# Patient Record
Sex: Male | Born: 1980 | Race: White | Hispanic: No | Marital: Single | Smoking: Current every day smoker
Health system: Southern US, Community
[De-identification: ages and names within clinical notes are randomized; demographics above are authoritative.]

## PROBLEM LIST (undated history)

## (undated) DIAGNOSIS — I10 Essential (primary) hypertension: Secondary | ICD-10-CM

---

## 2017-04-09 ENCOUNTER — Emergency Department (HOSPITAL_COMMUNITY): Payer: Worker's Compensation

## 2017-04-09 ENCOUNTER — Encounter (HOSPITAL_COMMUNITY)
Admission: EM | Disposition: A | Discharge status: Home / Self Care | Payer: Self-pay | Source: Home / Self Care | Attending: Emergency Medicine

## 2017-04-09 ENCOUNTER — Emergency Department (HOSPITAL_COMMUNITY)
Admission: EM | Admit: 2017-04-09 | Discharge: 2017-04-09 | Disposition: A | Discharge status: Home / Self Care | Payer: Worker's Compensation | Attending: Emergency Medicine | Admitting: Emergency Medicine

## 2017-04-09 ENCOUNTER — Encounter (HOSPITAL_COMMUNITY): Payer: Self-pay

## 2017-04-09 DIAGNOSIS — W231XXA Caught, crushed, jammed, or pinched between stationary objects, initial encounter: Secondary | ICD-10-CM | POA: Diagnosis not present

## 2017-04-09 DIAGNOSIS — Y929 Unspecified place or not applicable: Secondary | ICD-10-CM | POA: Insufficient documentation

## 2017-04-09 DIAGNOSIS — Z23 Encounter for immunization: Secondary | ICD-10-CM | POA: Insufficient documentation

## 2017-04-09 DIAGNOSIS — S68613A Complete traumatic transphalangeal amputation of left middle finger, initial encounter: Secondary | ICD-10-CM | POA: Diagnosis not present

## 2017-04-09 DIAGNOSIS — Y939 Activity, unspecified: Secondary | ICD-10-CM | POA: Diagnosis not present

## 2017-04-09 DIAGNOSIS — Y990 Civilian activity done for income or pay: Secondary | ICD-10-CM | POA: Insufficient documentation

## 2017-04-09 DIAGNOSIS — I10 Essential (primary) hypertension: Secondary | ICD-10-CM | POA: Diagnosis not present

## 2017-04-09 DIAGNOSIS — S6992XA Unspecified injury of left wrist, hand and finger(s), initial encounter: Secondary | ICD-10-CM | POA: Diagnosis present

## 2017-04-09 DIAGNOSIS — S62639B Displaced fracture of distal phalanx of unspecified finger, initial encounter for open fracture: Secondary | ICD-10-CM

## 2017-04-09 HISTORY — DX: Essential (primary) hypertension: I10

## 2017-04-09 SURGERY — AMPUTATION DIGIT
Anesthesia: Choice | Laterality: Left

## 2017-04-09 MED ORDER — SODIUM CHLORIDE 0.9 % IV SOLN
Freq: Once | INTRAVENOUS | Status: AC
  Administered 2017-04-09: 07:00:00 via INTRAVENOUS

## 2017-04-09 MED ORDER — TETANUS-DIPHTH-ACELL PERTUSSIS 5-2.5-18.5 LF-MCG/0.5 IM SUSP
0.5000 mL | Freq: Once | INTRAMUSCULAR | Status: AC
  Administered 2017-04-09: 0.5 mL via INTRAMUSCULAR
  Filled 2017-04-09: qty 0.5

## 2017-04-09 MED ORDER — BUPIVACAINE HCL (PF) 0.5 % IJ SOLN
10.0000 mL | Freq: Once | INTRAMUSCULAR | Status: AC
  Administered 2017-04-09: 10 mL
  Filled 2017-04-09: qty 10

## 2017-04-09 MED ORDER — LIDOCAINE HCL (PF) 1 % IJ SOLN
10.0000 mL | Freq: Once | INTRAMUSCULAR | Status: AC
  Administered 2017-04-09: 10 mL via INTRADERMAL
  Filled 2017-04-09: qty 10

## 2017-04-09 MED ORDER — ONDANSETRON HCL 4 MG/2ML IJ SOLN
4.0000 mg | Freq: Once | INTRAMUSCULAR | Status: AC
  Administered 2017-04-09: 4 mg via INTRAVENOUS
  Filled 2017-04-09: qty 2

## 2017-04-09 MED ORDER — HYDROMORPHONE HCL 1 MG/ML IJ SOLN
1.0000 mg | Freq: Once | INTRAMUSCULAR | Status: AC
  Administered 2017-04-09: 1 mg via INTRAVENOUS
  Filled 2017-04-09: qty 1

## 2017-04-09 MED ORDER — CEFAZOLIN SODIUM-DEXTROSE 2-4 GM/100ML-% IV SOLN
2.0000 g | Freq: Once | INTRAVENOUS | Status: AC
  Administered 2017-04-09: 2 g via INTRAVENOUS
  Filled 2017-04-09: qty 100

## 2017-04-09 MED ORDER — LIDOCAINE HCL (PF) 1 % IJ SOLN
10.0000 mL | Freq: Once | INTRAMUSCULAR | Status: AC
  Administered 2017-04-09: 10 mL
  Filled 2017-04-09: qty 10

## 2017-04-09 MED ORDER — POVIDONE-IODINE 10 % EX SWAB: 2.0000 "application " | Freq: Once | CUTANEOUS | Status: DC

## 2017-04-09 NOTE — Consult Note (Signed)
Reason for Consult: Left middle finger crush injury Referring Physician: Emergency Dept.  Seth Allen is an 36 y.o. male.  HPI: The patient is a pleasant [age]-year-old gentleman, who is ambidextrous, the presents to the emergency room setting where a new evaluation of his left middle finger. 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 He is previous been seen and evaluated emergency room setting. He was previously given a digital block and underwent a preliminary irrigation. At the time of his evaluation the digital block is fleeting. He denies any other injury. The finger has been previously wrapped with a wet gauze. He is accompanied with his supervisor from work as well as family members. The patient did bring in the amputated portion of the distal finger. He is currently receiving Ancef for antibiotic prophylaxis.  Past Medical History:  Diagnosis Date  . Hypertension     History reviewed. No pertinent surgical history.  No family history on file.  Social History: The patient smokes cigarettes, denies alcohol or drug use.  Allergies: No Known Allergies  Medications: None  No results found for this or any previous visit (from the past 48 hour(s)).  Dg Finger Middle Left  Result Date: 04/09/2017 CLINICAL DATA:  Work related injury EXAM: LEFT THIRD FINGER 2+V COMPARISON:  None. FINDINGS: Frontal, oblique, and lateral views were obtained. There is soft tissue injury to the distal aspect of the third digit with comminuted fracture of the mid to distal aspect of the third distal phalanx. A portion the third distal phalanx more distally has been amputated. Bony fragment is noted volar and lateral to the mid portion of the third distal phalanx. No other fractures. No dislocation. Joint spaces appear normal. No  erosive change. IMPRESSION: Comminuted fracture mid to distal aspects of the third distal phalanx with multiple displaced fragments. There has been amputation of a portion of the third distal phalanx. Extensive soft tissue injury evident. No fractures proximal to the third distal phalanx. No dislocation. No evident arthropathy. Electronically Signed   By: Bretta Bang III M.D.   On: 04/09/2017 15:20    Review of Systems  Constitutional: Negative.   HENT: Negative.   Eyes: Negative.   Respiratory: Negative.   Cardiovascular: Negative.   Gastrointestinal: Negative.   Musculoskeletal:       See history of present illness  Skin: Negative.   Neurological: Negative.   Endo/Heme/Allergies: Negative.    Blood pressure 111/77, pulse 89, temperature 98.1 F (36.7 C), temperature source Oral, resp. rate 15, SpO2 100 %. Physical Exam The patient is alert and oriented in no acute distress. The patient complains of pain in the affected upper extremity.  The patient is noted to have a normal HEENT exam. Lung fields show equal chest expansion and no shortness of breath. Abdomen exam is nontender without distention. Lower extremity examination does not show any fracture dislocation or blood clot symptoms. Pelvis is stable and the neck and back are stable and nontender Examination of the left hand shows that he has a significant degree of all tissue disarray with obvious a amputation of the distal phalanx at the level of the eponychial fold. He has exposed bony shards of the distal phalanx present extruding from wound, it appears FDS and terminal extensor tendon remained intact. Central slip is intact FDS is intact. Remaining examination of the  hand is within normal limits however he has a significant degree of grease and dirt about the hand and fingers Assessment/Plan: On-the-job injury to the left middle finger resulting in a partial amputation of the leftt middle finger with open fracture and  significant soft tissue disarray Tobacco abuse We have discussed with the patient the nature of his upper extremity predicament and our recommendations to proceed with urgent I and D and revision amputation. He understands this. Consent was obtained. Following this the patient was preliminarily prepped given the significant degree of grime about his hand. All in this digital block was implemented to the left middle finger utilizing a combination of 5 mL of Xylocaine without epinephrine as well as 5 mL of Marcaine without epinephrine. He tolerated this well and there were no complications. Once appropriate anesthetic was obtained the patient then underwent a second scrub and paint. Over 3 L of sterile water was used to irrigate the wound about the distal tip. Following this attention was turned to the distal tip, he underwent excisional debridement of skin subcutaneous tissue as well as a significant amount of bony debris. Utilizing a Ronguer the exposed distal phalanx was shortened and trimmed of any sharp edges. Further exploration showed that his terminal extensor tendon appeared intact as well as the FDP . There was no viable nailbed present as the amputation was at the level of the eponychial fold. Revision amputation was then performed and the wound was closed with a series of or 0 Prolene as well as 4-0 chromic. This is a complex Facilities managerlaceration/revision repair. He tolerated this well and there was no complicating  features. Finger tourniquet was released, refill is intact at the conclusion of the case. Following this nonadherent dressings to consist of Adaptic, Xeroform were placed on the wound off dressings as well as a malleable finger splint were then applied to the finger. We discussed with the patient at length elevation, edema control and to keep his dressings clean dry and intact. He will be discharged home on Bactrim DS 1 by mouth twice a day for 2 weeks for prophylaxis. We have discussed with him our  concerns for the potential of infection given the open nature and the debris that was present on his hands. He was given oxycodone for pain in addition, we have recommended Peri-Colace and vitamin C thousand milligrams 1 daily. He will be out of work to further notice. We will need to follow the patient in our office setting in approximately 8-10 days. He will need therapy thereafter for a custom-made distal tip protector. Should he have any issues, questions or concerns he will contact us immediately. Discussed with him the severity of his injury as well as potential for infection, loss of length about the digit and possible need for reconstructive measures into the future. All questions were encouraged and answered.  Curry Seefeldt L 04/09/2017, 7:24 PM

## 2017-04-09 NOTE — ED Notes (Signed)
Pt departed in NAD, refused use of wheelchair.  

## 2017-04-09 NOTE — ED Provider Notes (Addendum)
[facility]   CSN: [id number] Arrival date & time: 04/09/17  1446     History   Chief Complaint Chief Complaint  Patient presents with  . Finger Injury    HPI Seth AlconKevin C Bonaparte is a 36 y.o. male with history of tobacco use presents to the ED for left middle finger amputation sustained at work. He is left-handed. Bleeding is controlled. Last by mouth intake at 1330.  HPI  Past Medical History:  Diagnosis Date  . Hypertension     There are no active problems to display for this patient.   History reviewed. No pertinent surgical history.     Home Medications    Prior to Admission medications   Not on File    Family History No family history on file.  Social History Social History  Substance Use Topics  . Smoking status: Not on file  . Smokeless tobacco: Not on file  . Alcohol use Not on file     Allergies   Patient has no known allergies.   Review of Systems Review of Systems  Musculoskeletal: Positive for arthralgias.  Skin: Positive for color change and wound.  Neurological: Negative for numbness.     Physical Exam Updated Vital Signs BP 111/77 (BP Location: Right Arm)   Pulse 89   Temp 98.1 F (36.7 C) (Oral)   Resp 15   SpO2 100%   Physical Exam  Constitutional: He is oriented to person, place, and time. He appears well-developed and well-nourished. No distress.  NAD.  HENT:  Head: Normocephalic and atraumatic.  Right Ear: External ear normal.  Left Ear: External ear normal.  Nose: Nose normal.  Eyes: Conjunctivae and EOM are normal. No scleral icterus.  Neck: Normal range of motion. Neck supple.  Cardiovascular: Normal rate, regular rhythm, normal heart sounds and intact distal pulses.   No murmur heard. Pulmonary/Chest: Effort normal and breath sounds normal. He has no wheezes.  Musculoskeletal: Normal range of motion. He exhibits deformity.  Complete left middle amputation  at distal phalanx above first knuckle  Neurological: He is alert and oriented to person, place, and time.  Sensation to light touch intact in left middle finger  Skin: Skin is warm and dry. Capillary refill takes less than 2 seconds.  Psychiatric: He has a normal mood and affect. His behavior is normal. Judgment and thought content normal.  Nursing note and vitals reviewed.    ED Treatments / Results  Labs (all labs ordered are listed, but only abnormal results are displayed) Labs Reviewed - No data to display  EKG  EKG Interpretation None       Radiology Dg Finger Middle Left  Result Date: 04/09/2017 CLINICAL DATA:  Work related injury EXAM: LEFT THIRD FINGER 2+V COMPARISON:  None. FINDINGS: Frontal, oblique, and lateral views were obtained. There is soft tissue injury to the distal aspect of the third digit with comminuted fracture of the mid to distal aspect of the third distal phalanx. A portion the third distal phalanx more distally has been amputated. Bony fragment is noted volar and lateral to the mid portion of the third distal phalanx. No other fractures. No dislocation. Joint spaces appear normal. No erosive change. IMPRESSION: Comminuted fracture mid to distal aspects of the third distal phalanx with multiple displaced fragments. There has been amputation of a portion of the third distal phalanx. Extensive soft tissue injury evident. No fractures proximal to the third distal phalanx. No dislocation. No evident arthropathy.  Electronically Signed   By: Bretta Bang III M.D.   On: 04/09/2017 15:20    Procedures Procedures (including critical care time)  Medications Ordered in ED Medications  povidone-iodine 10 % swab 2 application (not administered)  lidocaine (PF) (XYLOCAINE) 1 % injection 10 mL (10 mLs Other Given 04/09/17 1531)  HYDROmorphone (DILAUDID) injection 1 mg (1 mg Intravenous Given 04/09/17 1538)  Tdap (BOOSTRIX) injection 0.5 mL (0.5 mLs Intramuscular  Given 04/09/17 1538)  0.9 %  sodium chloride infusion ( Intravenous Stopped 04/09/17 1714)  ondansetron (ZOFRAN) injection 4 mg (4 mg Intravenous Given 04/09/17 1544)  ceFAZolin (ANCEF) IVPB 2g/100 mL premix (2 g Intravenous New Bag/Given 04/09/17 1712)  bupivacaine (MARCAINE) 0.5 % injection 10 mL (10 mLs Infiltration Given 04/09/17 1713)  lidocaine (PF) (XYLOCAINE) 1 % injection 10 mL (10 mLs Intradermal Given 04/09/17 1713)     Initial Impression / Assessment and Plan / ED Course  I have reviewed the triage vital signs and the nursing notes.  Pertinent labs & imaging results that were available during my care of the patient were reviewed by me and considered in my medical decision making (see chart for details).  Clinical Course as of Apr 10 1947  Tue Apr 09, 2017  1610 Dr Amanda Pea evaluated pt and performed procedure in ED. Pt d/c from ED with prescriptions, he is advised to f/u with Dr Amanda Pea in 8-10 days.  [CG]    Clinical Course User Index [CG] Liberty Handy, PA-C   Orthopedic consultation who evaluated patient in the ED and perform digital block. Patient given Ancef, Dilaudid and small bolus in ED with adequate control of pain. Patient awaiting OR by Dr. Amanda Pea. Discussed plan of care with pt and boss at bedside, they are agreeable. NPO til OR.   Final Clinical Impressions(s) / ED Diagnoses   Final diagnoses:  Avulsion fracture of distal phalanx of finger, open, initial encounter    New Prescriptions New Prescriptions   No medications on file     Pricilla Loveless, MD 04/09/17 1710    Liberty Handy, PA-C 04/09/17 1948    Pricilla Loveless, MD 04/10/17 [phone]

## 2017-04-09 NOTE — Discharge Instructions (Signed)
Take bactrim as prescribed for antibiotic prophylaxis. Oxycodone for pain. You can add 600 mg ibuprofen for pain every 8 hours as well.  Follow up with Dr. Amanda PeaGramig in 8-10 days in office for re-evaluation.  Return to ED for if you develop fevers, chills, swelling, redness, yellow drainage or any other signs of infection.  Tetanus was updated today.

## 2017-04-09 NOTE — Consult Note (Signed)
Reason for Consult:Amp left middle finger Referring Physician: Alice ReichertS Allen  Seth Allen is an 36 y.o. male.  HPI: Seth Allen was working with a pipe when the sleeve snapped back and sheared off the tip of his middle finger. Seth Allen is LHD.   Past Medical History:  Diagnosis Date  . Hypertension     History reviewed. No pertinent surgical history.  No family history on file.  Social History:  has no tobacco, alcohol, and drug history on file.  Allergies: No Known Allergies  Medications: I have reviewed the patient'Seth current medications.  No results found for this or any previous visit (from the past 48 hour(Seth)).  Dg Finger Middle Left  Result Date: 04/09/2017 CLINICAL DATA:  Work related injury EXAM: LEFT THIRD FINGER 2+V COMPARISON:  None. FINDINGS: Frontal, oblique, and lateral views were obtained. There is soft tissue injury to the distal aspect of the third digit with comminuted fracture of the mid to distal aspect of the third distal phalanx. A portion the third distal phalanx more distally has been amputated. Bony fragment is noted volar and lateral to the mid portion of the third distal phalanx. No other fractures. No dislocation. Joint spaces appear normal. No erosive change. IMPRESSION: Comminuted fracture mid to distal aspects of the third distal phalanx with multiple displaced fragments. There has been amputation of a portion of the third distal phalanx. Extensive soft tissue injury evident. No fractures proximal to the third distal phalanx. No dislocation. No evident arthropathy. Electronically Signed   By: Bretta BangWilliam  Woodruff III M.D.   On: 04/09/2017 15:20    Review of Systems  Constitutional: Negative for weight loss.  HENT: Negative for ear discharge, ear pain, hearing loss and tinnitus.   Eyes: Negative for blurred vision, double vision, photophobia and pain.  Respiratory: Negative for cough, sputum production and shortness of breath.   Cardiovascular: Negative for chest  pain.  Gastrointestinal: Negative for abdominal pain, nausea and vomiting.  Genitourinary: Negative for dysuria, flank pain, frequency and urgency.  Musculoskeletal: Positive for joint pain (left middle finger). Negative for back pain, falls, myalgias and neck pain.  Neurological: Negative for dizziness, tingling, sensory change, focal weakness, loss of consciousness and headaches.  Endo/Heme/Allergies: Does not bruise/bleed easily.  Psychiatric/Behavioral: Negative for depression, memory loss and substance abuse. The patient is not nervous/anxious.    Blood pressure 111/77, pulse 89, temperature 98.1 F (36.7 C), temperature source Oral, resp. rate 15, SpO2 100 %. Physical Exam  Constitutional: Seth Allen appears well-developed and well-nourished. No distress.  HENT:  Head: Normocephalic.  Eyes: Conjunctivae are normal. Right eye exhibits no discharge. Left eye exhibits no discharge. No scleral icterus.  Neck: Normal range of motion.  Cardiovascular: Normal rate and regular rhythm.   Respiratory: Effort normal. No respiratory distress.  Neurological: Seth Allen is alert.  Skin: Skin is warm and dry. Seth Allen is not diaphoretic.  Psychiatric: Seth Allen has a normal mood and affect. His behavior is normal.      Assessment/Plan: Left middle finger distal phalanx open fx -- Digital block for pain control. IV ancef. Will need to go to OR for amputation revision by Dr. Amanda PeaGramig. NPO until then.     Freeman CaldronMichael J. Naje Rice, PA-C Orthopedic Surgery [phone] 04/09/2017, 4:09 PM

## 2017-04-09 NOTE — ED Triage Notes (Signed)
Pt presents for left middle finger amputation at second joint. Pt was at work, bleeding controlled. Received 75 mcg fentanyl by EMS PTA. Pt reports took goody power and drank bottle of water around 1330 following event.

## 2017-06-30 ENCOUNTER — Encounter (HOSPITAL_COMMUNITY): Payer: Self-pay

## 2017-06-30 ENCOUNTER — Emergency Department (HOSPITAL_COMMUNITY)
Admission: EM | Admit: 2017-06-30 | Discharge: 2017-06-30 | Discharge status: Against Medical Advice | Payer: 59 | Attending: Emergency Medicine | Admitting: Emergency Medicine

## 2017-06-30 ENCOUNTER — Other Ambulatory Visit: Payer: Self-pay

## 2017-06-30 ENCOUNTER — Emergency Department (HOSPITAL_COMMUNITY): Payer: 59

## 2017-06-30 DIAGNOSIS — Y999 Unspecified external cause status: Secondary | ICD-10-CM | POA: Diagnosis not present

## 2017-06-30 DIAGNOSIS — Y9389 Activity, other specified: Secondary | ICD-10-CM | POA: Diagnosis not present

## 2017-06-30 DIAGNOSIS — Z5329 Procedure and treatment not carried out because of patient's decision for other reasons: Secondary | ICD-10-CM | POA: Insufficient documentation

## 2017-06-30 DIAGNOSIS — Y9289 Other specified places as the place of occurrence of the external cause: Secondary | ICD-10-CM | POA: Insufficient documentation

## 2017-06-30 DIAGNOSIS — R05 Cough: Secondary | ICD-10-CM | POA: Diagnosis not present

## 2017-06-30 DIAGNOSIS — S39011A Strain of muscle, fascia and tendon of abdomen, initial encounter: Secondary | ICD-10-CM | POA: Diagnosis not present

## 2017-06-30 DIAGNOSIS — F17210 Nicotine dependence, cigarettes, uncomplicated: Secondary | ICD-10-CM | POA: Insufficient documentation

## 2017-06-30 DIAGNOSIS — R3912 Poor urinary stream: Secondary | ICD-10-CM | POA: Diagnosis not present

## 2017-06-30 DIAGNOSIS — X509XXA Other and unspecified overexertion or strenuous movements or postures, initial encounter: Secondary | ICD-10-CM | POA: Insufficient documentation

## 2017-06-30 DIAGNOSIS — S3991XA Unspecified injury of abdomen, initial encounter: Secondary | ICD-10-CM | POA: Diagnosis present

## 2017-06-30 DIAGNOSIS — T148XXA Other injury of unspecified body region, initial encounter: Secondary | ICD-10-CM

## 2017-06-30 DIAGNOSIS — R0789 Other chest pain: Secondary | ICD-10-CM | POA: Insufficient documentation

## 2017-06-30 DIAGNOSIS — I10 Essential (primary) hypertension: Secondary | ICD-10-CM | POA: Insufficient documentation

## 2017-06-30 DIAGNOSIS — R109 Unspecified abdominal pain: Secondary | ICD-10-CM

## 2017-06-30 LAB — URINALYSIS, ROUTINE W REFLEX MICROSCOPIC
Bilirubin Urine: NEGATIVE
Glucose, UA: NEGATIVE mg/dL
Hgb urine dipstick: NEGATIVE
Ketones, ur: NEGATIVE mg/dL
LEUKOCYTES UA: NEGATIVE
Nitrite: NEGATIVE
PROTEIN: NEGATIVE mg/dL
SPECIFIC GRAVITY, URINE: 1.019 (ref 1.005–1.030)
pH: 5 (ref 5.0–8.0)

## 2017-06-30 MED ORDER — IBUPROFEN 800 MG PO TABS
800.0000 mg | ORAL_TABLET | Freq: Once | ORAL | Status: DC
  Filled 2017-06-30: qty 1

## 2017-06-30 MED ORDER — CYCLOBENZAPRINE HCL 10 MG PO TABS
10.0000 mg | ORAL_TABLET | Freq: Once | ORAL | Status: DC
  Filled 2017-06-30: qty 1

## 2017-06-30 MED ORDER — DEXAMETHASONE SODIUM PHOSPHATE 10 MG/ML IJ SOLN
10.0000 mg | Freq: Once | INTRAMUSCULAR | Status: DC
  Filled 2017-06-30: qty 1

## 2017-06-30 NOTE — ED Notes (Signed)
Pt not in room or in any bathrooms on unit.

## 2017-06-30 NOTE — ED Triage Notes (Signed)
Pt coughed at work on Tuesday and stated that he has pain starting from right rib going into lower back. Now has tenderness on the right lower part of his back. Pt is also complaining of left knee cap pain. States when he walks it pops. Pt states he also has urinary symptoms. States he had to urinate and felt like he had to go a lot, but was only able to produce a little bit of urine. Pt states he also feels tired.

## 2017-06-30 NOTE — ED Provider Notes (Signed)
[facility] EMERGENCY DEPARTMENT Provider Note   CSN: [id number] Arrival date & time: 06/30/17  2014     History   Chief Complaint Chief Complaint  Patient presents with  . Back Pain    HPI Seth Allen is a 37 y.o. male.  Patient is a 37 year old male who presents to the emergency department with a complaint of back and flank area pain.  The patient states that 5 days ago he had a cough, and he started having pain in the right rib area.  This gradually made his way around to the lower back.  He states the pain has been getting progressively worse and he was concerned that he may have injured a rib or injured some other areas internally.  The patient also states that he works in [employer].  He states that he hears and feels a pop in his left knee, and he is also concerned that since the cough he has been urinating less than he thinks he usually urinates.  He presents now for evaluation of these various issues.  He denies of chills or fever.  He denies blood in the urine.  He is not had any problems with kidney stones.  There is been no operations or procedures involving his chest or his kidney area.      Past Medical History:  Diagnosis Date  . Hypertension     There are no active problems to display for this patient.   History reviewed. No pertinent surgical history.     Home Medications    Prior to Admission medications   Not on File    Family History No family history on file.  Social History Social History   Tobacco Use  . Smoking status: Current Every Day Smoker    Packs/day: 2.00    Types: Cigarettes  . Smokeless tobacco: Never Used  Substance Use Topics  . Alcohol use: No    Frequency: Never  . Drug use: No     Allergies   Patient has no known allergies.   Review of Systems Review of Systems  Constitutional: Negative for activity change, appetite change, chills and fever.       All ROS Neg except as noted in HPI  HENT: Negative for  nosebleeds.   Eyes: Negative for photophobia and discharge.  Respiratory: Negative for cough, shortness of breath and wheezing.        Chest wall pain  Cardiovascular: Negative for chest pain and palpitations.  Gastrointestinal: Negative for abdominal pain and blood in stool.  Genitourinary: Positive for decreased urine volume and frequency. Negative for dysuria and hematuria.  Musculoskeletal: Positive for arthralgias. Negative for back pain and neck pain.  Skin: Negative.   Neurological: Negative for dizziness, seizures and speech difficulty.  Psychiatric/Behavioral: Negative for confusion and hallucinations.     Physical Exam Updated Vital Signs BP (!) 122/106 (BP Location: Right Arm)   Pulse (!) 110   Temp 97.9 F (36.6 C) (Oral)   Resp 15   Ht 6\' 2"  (1.88 m)   Wt 117.1 kg (258 lb 1 oz)   SpO2 96%   BMI 33.13 kg/m   Physical Exam  Constitutional: He is oriented to person, place, and time. He appears well-developed and well-nourished.  Non-toxic appearance.  HENT:  Head: Normocephalic.  Right Ear: Tympanic membrane and external ear normal.  Left Ear: Tympanic membrane and external ear normal.  Eyes: EOM and lids are normal. Pupils are equal, round, and reactive to light.  Neck: Normal range of motion. Neck supple. Carotid bruit is not present.  Cardiovascular: Normal rate, regular rhythm, normal heart sounds, intact distal pulses and normal pulses.  Pulmonary/Chest: Breath sounds normal. No respiratory distress. He exhibits tenderness.    Abdominal: Soft. Bowel sounds are normal. There is no tenderness. There is no guarding.  Musculoskeletal: Normal range of motion.       Lumbar back: He exhibits pain.       Back:  Lymphadenopathy:       Head (right side): No submandibular adenopathy present.       Head (left side): No submandibular adenopathy present.    He has no cervical adenopathy.  Neurological: He is alert and oriented to person, place, and time. He has normal  strength. No cranial nerve deficit or sensory deficit.  Skin: Skin is warm and dry.  Psychiatric: He has a normal mood and affect. His speech is normal.  Nursing note and vitals reviewed.    ED Treatments / Results  Labs (all labs ordered are listed, but only abnormal results are displayed) Labs Reviewed  URINALYSIS, ROUTINE W REFLEX MICROSCOPIC    EKG  EKG Interpretation None       Radiology No results found.  Procedures Procedures (including critical care time)  Medications Ordered in ED Medications  ibuprofen (ADVIL,MOTRIN) tablet 800 mg (not administered)  cyclobenzaprine (FLEXERIL) tablet 10 mg (not administered)  dexamethasone (DECADRON) injection 10 mg (not administered)     Initial Impression / Assessment and Plan / ED Course  I have reviewed the triage vital signs and the nursing notes.  Pertinent labs & imaging results that were available during my care of the patient were reviewed by me and considered in my medical decision making (see chart for details).       Final Clinical Impressions(s) / ED Diagnoses MDM Blood pressure is elevated at 122/106, otherwise the vital signs are within normal limits.  I can reproduce the pain with range of motion of the back and flank area.  There is no palpable hematoma.  No evidence of a bruise.  No deformity palpated.  I discussed with the patient that we would check a urine, as well as x-ray of the ribs and chest.  Patient was in agreement with this plan.  Urine analysis is negative for acute problem.  In particular there is no blood noted on the dipstick.  Arrangements were made for the patient to have the x-rays.  Nursing staff is called the patient twice and visited the room on more than 2 occasions and the patient is not in the room.  It is believed that he has eloped.   Final diagnoses:  Muscle strain  Right flank pain    ED Discharge Orders    None       Ivery QualeBryant, Bentleigh Waren, PA-C 06/30/17 2229      Samuel JesterMcManus, Kathleen, DO 07/02/17 1516

## 2019-11-04 ENCOUNTER — Encounter (HOSPITAL_COMMUNITY): Payer: Self-pay | Admitting: Emergency Medicine

## 2019-11-04 ENCOUNTER — Other Ambulatory Visit: Payer: Self-pay

## 2019-11-04 ENCOUNTER — Emergency Department (HOSPITAL_COMMUNITY): Payer: Self-pay

## 2019-11-04 ENCOUNTER — Emergency Department (HOSPITAL_COMMUNITY)
Admission: EM | Admit: 2019-11-04 | Discharge: 2019-11-04 | Disposition: A | Discharge status: Home / Self Care | Payer: Self-pay | Attending: Emergency Medicine | Admitting: Emergency Medicine

## 2019-11-04 DIAGNOSIS — F17200 Nicotine dependence, unspecified, uncomplicated: Secondary | ICD-10-CM | POA: Insufficient documentation

## 2019-11-04 DIAGNOSIS — Y939 Activity, unspecified: Secondary | ICD-10-CM | POA: Insufficient documentation

## 2019-11-04 DIAGNOSIS — S0101XA Laceration without foreign body of scalp, initial encounter: Secondary | ICD-10-CM | POA: Insufficient documentation

## 2019-11-04 DIAGNOSIS — Y929 Unspecified place or not applicable: Secondary | ICD-10-CM | POA: Insufficient documentation

## 2019-11-04 DIAGNOSIS — Y999 Unspecified external cause status: Secondary | ICD-10-CM | POA: Insufficient documentation

## 2019-11-04 DIAGNOSIS — S0990XA Unspecified injury of head, initial encounter: Secondary | ICD-10-CM

## 2019-11-04 DIAGNOSIS — W228XXA Striking against or struck by other objects, initial encounter: Secondary | ICD-10-CM | POA: Insufficient documentation

## 2019-11-04 LAB — RAPID URINE DRUG SCREEN, HOSP PERFORMED
Amphetamines: POSITIVE — AB
Barbiturates: NOT DETECTED
Benzodiazepines: NOT DETECTED
Cocaine: NOT DETECTED
Opiates: NOT DETECTED
Tetrahydrocannabinol: POSITIVE — AB

## 2019-11-04 LAB — ETHANOL: Alcohol, Ethyl (B): <10 mg/dL (ref <10)

## 2019-11-04 MED ORDER — LIDOCAINE-EPINEPHRINE (PF) 2 %-1:200000 IJ SOLN
10.0000 mL | Freq: Once | INTRAMUSCULAR | Status: AC
  Administered 2019-11-04: 10 mL via INTRADERMAL
  Filled 2019-11-04: qty 10

## 2019-11-04 NOTE — ED Provider Notes (Signed)
Pt's care assumed at 10 pm.  Ct head and neck no acute abnormality,  UDS positive for THC and amph. Pt observed ambulating.  Pt seems more alert.     Seth Allen 11/04/19 2334    Linwood Dibbles, MD 11/08/19 [phone]

## 2019-11-04 NOTE — ED Provider Notes (Signed)
[facility] EMERGENCY DEPARTMENT Provider Note   CSN: [id number] Arrival date & time: 11/04/19  2050     History Chief Complaint  Patient presents with  . Head Injury  . Laceration    Seth Allen is a 39 y.o. male.  HPI   39 year old male presenting for evaluation of head injury.  States that prior to arrival he was walking down the stairs and he was on the second the last stair when he jumped up.  States he hit his head on a beam along the ceiling.  He then fell down onto his back.  States he lost consciousness and has a headache and feels dizzy.  He denies any visual changes, nausea, vomiting, unilateral numbness/weakness.  He denies being on any blood thinners.  He is also complaining of neck pain and pain to the left lower back.  States his Tdap was updated in 2018.  History reviewed. No pertinent past medical history.  There are no problems to display for this patient.   History reviewed. No pertinent surgical history.     History reviewed. No pertinent family history.  Social History   Tobacco Use  . Smoking status: Current Every Day Smoker  . Smokeless tobacco: Never Used  Substance Use Topics  . Alcohol use: Yes  . Drug use: Never    Home Medications Prior to Admission medications   Not on File    Allergies    Patient has no known allergies.  Review of Systems   Review of Systems  Constitutional: Negative for fever.  HENT: Negative for dental problem.   Eyes: Negative for visual disturbance.  Respiratory: Negative for shortness of breath.   Cardiovascular: Negative for chest pain.  Gastrointestinal: Negative for nausea and vomiting.  Genitourinary: Negative for flank pain.  Musculoskeletal: Positive for back pain and neck pain.  Skin: Positive for wound.  Neurological: Positive for dizziness and headaches. Negative for weakness and numbness.       Head injury. LOC.     Physical Exam Updated Vital Signs BP (!) 139/100 (BP Location: Right  Arm)   Pulse (!) 103   Temp 98 F (36.7 C) (Oral)   Resp 18   Ht 6\' 1"  (1.854 m)   Wt 99.8 kg   SpO2 96%   BMI 29.03 kg/m   Physical Exam Vitals and nursing note reviewed.  Constitutional:      Appearance: He is well-developed.  HENT:     Head: Normocephalic.     Comments: Swelling and TTP to the superior aspect of the head with 4cm laceration noted Eyes:     Conjunctiva/sclera: Conjunctivae normal.  Neck:     Comments: In ccollar, TTP to the upper cervical spine Cardiovascular:     Rate and Rhythm: Normal rate and regular rhythm.     Heart sounds: No murmur.  Pulmonary:     Effort: Pulmonary effort is normal. No respiratory distress.     Breath sounds: Normal breath sounds.  Abdominal:     Palpations: Abdomen is soft.     Tenderness: There is no abdominal tenderness.  Musculoskeletal:     Cervical back: Neck supple.     Comments: No TTP to the thoracic or lumbar spine. TTP noted to the left iliac crest.   Skin:    General: Skin is warm and dry.  Neurological:     Mental Status: He is alert.     Comments: Mental Status:  Sleepy but arousable to voice, thought content appropriate, able  to give a coherent history. Speech fluent without evidence of aphasia. Able to follow 2 step commands without difficulty.  Cranial Nerves:  II: pupils equal, round, reactive to light III,IV, VI: ptosis not present, extra-ocular motions intact bilaterally  V,VII: smile symmetric, facial light touch sensation equal VIII: hearing grossly normal to voice  X: uvula elevates symmetrically  XI: bilateral shoulder shrug symmetric and strong XII: midline tongue extension without fassiculations Motor:  Normal tone. 5/5 strength of BUE and BLE major muscle groups including strong and equal grip strength and dorsiflexion/plantar flexion Sensory: abnormal sensation to a portion of the LUE, otherwise reassuring Gait: not assessed      ED Results / Procedures / Treatments   Labs (all labs  ordered are listed, but only abnormal results are displayed) Labs Reviewed  ETHANOL  RAPID URINE DRUG SCREEN, HOSP PERFORMED    EKG None  Radiology CT Head Wo Contrast  Result Date: 11/04/2019 CLINICAL DATA:  Posttraumatic headache. Struck head on ceiling. Laceration to top of head. Altered mental status. EXAM: CT HEAD WITHOUT CONTRAST TECHNIQUE: Contiguous axial images were obtained from the base of the skull through the vertex without intravenous contrast. COMPARISON:  None. FINDINGS: Brain: No intracranial hemorrhage, mass effect, or midline shift. No hydrocephalus. The basilar cisterns are patent. No evidence of territorial infarct or acute ischemia. No extra-axial or intracranial fluid collection. Vascular: No hyperdense vessel or unexpected calcification. Skull: No fracture or focal lesion. Sinuses/Orbits: Mucosal thickening throughout the ethmoid air cells and frontal sinuses. No significant fluid level. The mastoid air cells are clear. Included orbits are unremarkable. Other: Stapled vertex scalp hematoma. IMPRESSION: Stapled vertex scalp hematoma. No acute intracranial abnormality. No skull fracture. Electronically Signed   By: Keith Rake M.D.   On: 11/04/2019 21:51   CT Cervical Spine Wo Contrast  Result Date: 11/04/2019 CLINICAL DATA:  Spine fracture, cervical, traumatic Injury. Altered mental status. EXAM: CT CERVICAL SPINE WITHOUT CONTRAST TECHNIQUE: Multidetector CT imaging of the cervical spine was performed without intravenous contrast. Multiplanar CT image reconstructions were also generated. COMPARISON:  None. FINDINGS: Alignment: Normal. Skull base and vertebrae: No acute fracture. Vertebral body heights are maintained. The dens and skull base are intact. There is a bone island within right aspect of C1. Soft tissues and spinal canal: No prevertebral fluid or swelling. No visible canal hematoma. Disc levels: Endplate spurring at multiple levels with mild disc space narrowing  at C6-C7. Facet hypertrophy at C2-C3 more prominent on the left. Upper chest: No acute findings. Incidental azygos fissure. Other: None. IMPRESSION: Mild degenerative change in the cervical spine without acute fracture or subluxation. Electronically Signed   By: Keith Rake M.D.   On: 11/04/2019 21:54   DG Hip Unilat W or Wo Pelvis 2-3 Views Left  Result Date: 11/04/2019 CLINICAL DATA:  Fall with left hip pain EXAM: DG HIP (WITH OR WITHOUT PELVIS) 2-3V LEFT COMPARISON:  None. FINDINGS: There is no evidence of hip fracture or dislocation. There is no evidence of arthropathy or other focal bone abnormality. IMPRESSION: Negative. Electronically Signed   By: Donavan Foil M.D.   On: 11/04/2019 21:49    Procedures .Marland KitchenLaceration Repair  Date/Time: 11/04/2019 9:49 PM Performed by: Rodney Booze, PA-C Authorized by: Rodney Booze, PA-C   Consent:    Consent obtained:  Verbal   Consent given by:  Patient   Risks discussed:  Infection, need for additional repair and poor cosmetic result   Alternatives discussed:  No treatment Anesthesia (see MAR for  exact dosages):    Anesthesia method:  Local infiltration   Local anesthetic:  Lidocaine 2% WITH epi Laceration details:    Location:  Scalp   Scalp location:  Crown   Length (cm):  4 Repair type:    Repair type:  Simple Pre-procedure details:    Preparation:  Patient was prepped and draped in usual sterile fashion Exploration:    Hemostasis achieved with:  Epinephrine and direct pressure   Wound exploration: wound explored through full range of motion and entire depth of wound probed and visualized     Wound extent: no underlying fracture noted     Contaminated: no   Treatment:    Area cleansed with:  Saline   Amount of cleaning:  Extensive   Irrigation solution:  Sterile saline   Irrigation volume:  1L   Irrigation method:  Pressure wash   Visualized foreign bodies/material removed: no   Skin repair:    Repair method:   Staples   Number of staples:  7 Approximation:    Approximation:  Close Post-procedure details:    Dressing:  Open (no dressing)   Patient tolerance of procedure:  Tolerated well, no immediate complications   (including critical care time)  Medications Ordered in ED Medications  lidocaine-EPINEPHrine (XYLOCAINE W/EPI) 2 %-1:200000 (PF) injection 10 mL (10 mLs Intradermal Given 11/04/19 2109)    ED Course  I have reviewed the triage vital signs and the nursing notes.  Pertinent labs & imaging results that were available during my care of the patient were reviewed by me and considered in my medical decision making (see chart for details).    MDM Rules/Calculators/A&P                      39 year old male presenting for evaluation of head injury prior to arrival with positive LOC.  Also fell onto his back and is complaining of neck pain and pain to the left lower pelvis.  He is grossly neuro intact on my exam but is sleepy.  Will obtain CT head, cervical spine, pelvis x-ray.  He also has a laceration to the top of his head that was irrigated copiously with normal saline and closed with staples.  He will need these removed in 5 days.  His Tdap is up-to-date.  Xray left hip/pelvis is neg for acute traumatic injury.   CT head neg for skull fx or intracranial bleeding. CT cervical spine is neg for acute traumatic injury.   Pt appears somnolent on exam, but imaging is reassuring. This may be post concussive, but will add an ETOH and UDS for further eval and continue to monitor him. Care transitioned to Langston Masker, PA-C at shift change pending remainder of w/u and reassessment. Will defer to her for final disposition decision.  Final Clinical Impression(s) / ED Diagnoses Final diagnoses:  Injury of head, initial encounter  Laceration of scalp, initial encounter    Rx / DC Orders ED Discharge Orders    None       Rayne Du 11/04/19 2201    Linwood Dibbles,  MD 11/08/19 [phone]

## 2019-11-04 NOTE — ED Triage Notes (Signed)
Pt states that he was horse playing with a friend and hit his head on the ceiling causing about a 2in laceration to top of head.

## 2019-11-04 NOTE — Discharge Instructions (Addendum)
Please follow-up for suture removal at either urgent care ,the emergency department, or your primary care doctor in 5 days.  Please return to the emergency room immediately if you experience any new or worsening symptoms or any symptoms that indicate worsening infection such as fevers, increased redness/swelling/pain, warmth, or drainage from the affected area.   HEAD INJURY If any of the following occur notify your physician or go to the Hospital Emergency Department:  Increased drowsiness, stupor or loss of consciousness  Restlessness or convulsions (fits)  Paralysis in arms or legs  Temperature above 100 F  Vomiting  Severe headache  Blood or clear fluid dripping from the nose or ears  Stiffness of the neck  Dizziness or blurred vision  Pulsating pain in the eye  Unequal pupils of eye  Personality changes  Any other unusual symptoms PRECAUTIONS  Do not take tranquilizers, sedatives, narcotics or alcohol  Avoid aspirin. Use only acetaminophen (e.g. Tylenol) or ibuprofen (e.g. Advil) for relief of pain. Follow directions on the bottle for dosage.  Use ice packs for comfort  Getting plenty of rest and sleep helps the brain to heal. Do not try to do too much too fast. As you start to feel better, you can slowly and gradually return to your usual routine.  Avoid activities that are physically demanding (e.g., sports, heavy housecleaning, exercising) or require a lot of thinking or concentration (e.g., working on the computer, playing video games).  Ask your health care professional when you can safely drive a car, ride a bike, or operate heavy equipment. MEDICATIONS Use medications only as directed by your physician  Follow up with your primary care provider within 5-7 days for re-evaluation.

## 2019-11-11 ENCOUNTER — Encounter (HOSPITAL_COMMUNITY): Payer: Self-pay | Admitting: Emergency Medicine

## 2019-11-11 ENCOUNTER — Emergency Department (HOSPITAL_COMMUNITY)
Admission: EM | Admit: 2019-11-11 | Discharge: 2019-11-11 | Disposition: A | Discharge status: Home / Self Care | Payer: 59 | Attending: Emergency Medicine | Admitting: Emergency Medicine

## 2019-11-11 ENCOUNTER — Other Ambulatory Visit: Payer: Self-pay

## 2019-11-11 DIAGNOSIS — Z4802 Encounter for removal of sutures: Secondary | ICD-10-CM

## 2019-11-11 DIAGNOSIS — S0181XD Laceration without foreign body of other part of head, subsequent encounter: Secondary | ICD-10-CM | POA: Insufficient documentation

## 2019-11-11 DIAGNOSIS — F17210 Nicotine dependence, cigarettes, uncomplicated: Secondary | ICD-10-CM | POA: Insufficient documentation

## 2019-11-11 DIAGNOSIS — I10 Essential (primary) hypertension: Secondary | ICD-10-CM | POA: Insufficient documentation

## 2019-11-11 DIAGNOSIS — X58XXXD Exposure to other specified factors, subsequent encounter: Secondary | ICD-10-CM | POA: Insufficient documentation

## 2019-11-11 NOTE — ED Triage Notes (Signed)
Needs staples removed from top of head

## 2019-11-11 NOTE — ED Provider Notes (Signed)
[facility] EMERGENCY DEPARTMENT Provider Note   CSN: [id number] Arrival date & time: 11/11/19  1445     History Chief Complaint  Patient presents with  . Suture / Staple Removal    Seth Allen is a 39 y.o. male.  39 year old male presents with request for staple removal, states that staples were placed in this ER approximately 1 week ago after unknown incident.  Patient states that he woke up as he was getting staples in his head and was then sent to jail.  Patient is vague about the details regarding his injury, reports he did have a CT of his head and may be some x-rays. Reports last td was 2018.        Past Medical History:  Diagnosis Date  . Hypertension     There are no problems to display for this patient.   History reviewed. No pertinent surgical history.     No family history on file.  Social History   Tobacco Use  . Smoking status: Current Every Day Smoker    Packs/day: 2.00    Types: Cigarettes  . Smokeless tobacco: Never Used  Substance Use Topics  . Alcohol use: No  . Drug use: No    Home Medications Prior to Admission medications   Not on File    Allergies    Patient has no known allergies.  Review of Systems   Review of Systems  Constitutional: Negative for fever.  Skin: Positive for wound.  Allergic/Immunologic: Negative for immunocompromised state.    Physical Exam Updated Vital Signs BP (!) 136/101 (BP Location: Left Arm)   Pulse (!) 102   Temp 98.1 F (36.7 C) (Oral)   Resp 18   Ht 6\' 2"  (1.88 m)   Wt 117 kg   SpO2 99%   BMI 33.12 kg/m   Physical Exam Vitals and nursing note reviewed.  Constitutional:      General: He is not in acute distress.    Appearance: He is well-developed. He is not diaphoretic.  HENT:     Head: Normocephalic and atraumatic.   Pulmonary:     Effort: Pulmonary effort is normal.  Skin:    General: Skin is warm and dry.     Findings: No erythema.  Neurological:     Mental Status: He  is alert and oriented to person, place, and time.  Psychiatric:        Behavior: Behavior normal.     ED Results / Procedures / Treatments   Labs (all labs ordered are listed, but only abnormal results are displayed) Labs Reviewed - No data to display  EKG None  Radiology No results found.  Procedures .Suture Removal  Date/Time: 11/11/2019 3:44 PM Performed by: 11/13/2019, PA-C Authorized by: Jeannie Fend, PA-C   Consent:    Consent obtained:  Verbal   Consent given by:  Patient   Risks discussed:  Bleeding, pain and wound separation   Alternatives discussed:  No treatment Location:    Location:  Head/neck   Head/neck location:  Scalp Procedure details:    Wound appearance:  No signs of infection   Number of staples removed:  7 Post-procedure details:    Post-removal:  No dressing applied   Patient tolerance of procedure:  Tolerated well, no immediate complications   (including critical care time)  Medications Ordered in ED Medications - No data to display  ED Course  I have reviewed the triage vital signs and the nursing notes.  Pertinent labs & imaging results that were available during my care of the patient were reviewed by me and considered in my medical decision making (see chart for details).  Clinical Course as of Nov 11 1542  Wed Nov 11, 6926  4832 39 year old male with request for staple removal.  Patient is unsure how long the staples were put in place, stated that about a week.  No prior records available to review despite patient stating he came to this emergency room.  Patient states there are 7 staples, on exam 7 staples are present without signs of secondary infection, removed without difficulty and tolerated well. Td is UTD.   [LM]    Clinical Course User Index [LM] Roque Lias   MDM Rules/Calculators/A&P                      Final Clinical Impression(s) / ED Diagnoses Final diagnoses:  Encounter for staple removal     Rx / DC Orders ED Discharge Orders    None       Tacy Learn, PA-C 11/11/19 1544    Milton Ferguson, MD 11/12/19 1053

## 2020-09-01 IMAGING — CT CT HEAD W/O CM
2 of 6 series · 11 of 47 positions shown, 13 images · non-contrast
Comparison: None.

CLINICAL DATA: Posttraumatic headache. Struck head on ceiling.
Laceration to top of head. Altered mental status.

EXAM:
CT HEAD WITHOUT CONTRAST
TECHNIQUE: Contiguous axial images were obtained from the base of the skull
through the vertex without intravenous contrast.

[Series 2: head w o · axial · 0.46mm/px · z∈[-7,+118]mm · 8 of 31 slices shown, 10 images]
[im 3/31  brain]
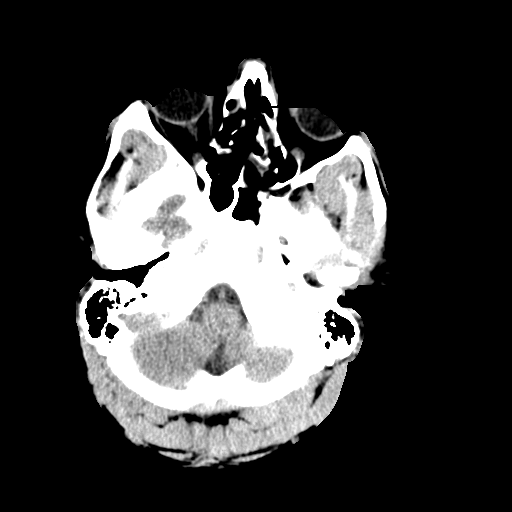
[im 3/31  bone]
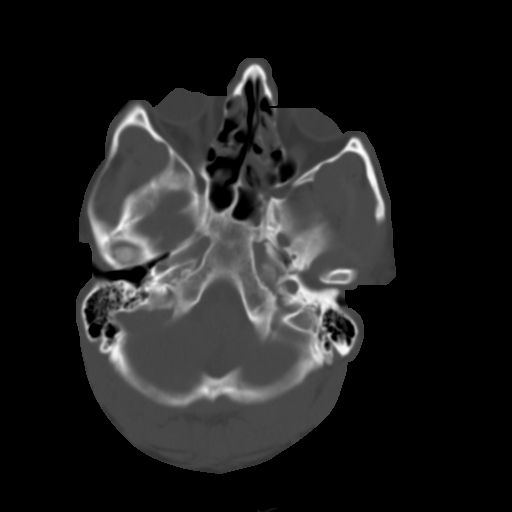
[im 6/31  brain]
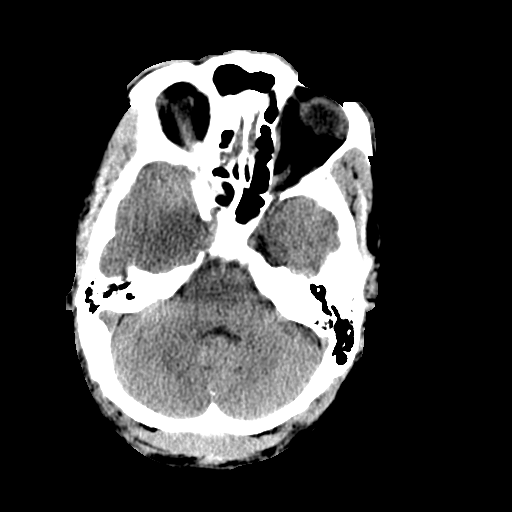
[im 10/31  brain]
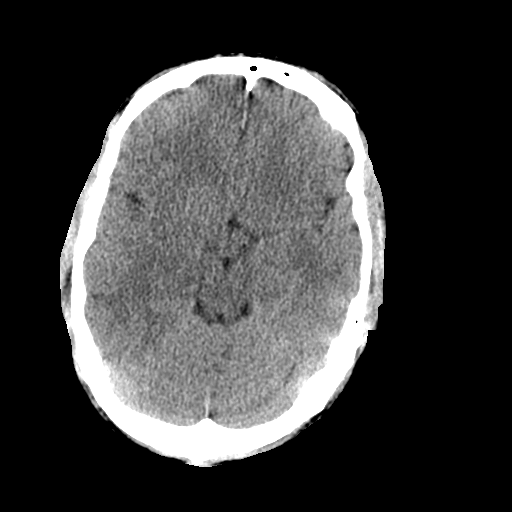
[im 14/31  brain]
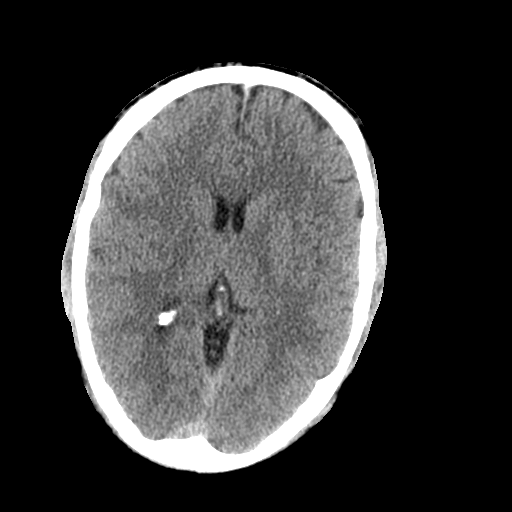
[im 17/31  brain]
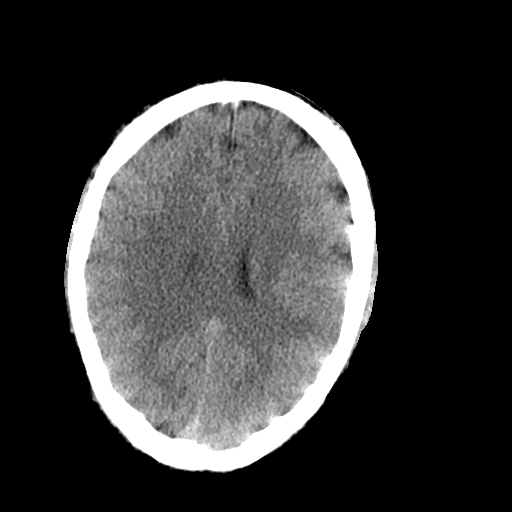
[im 17/31  bone]
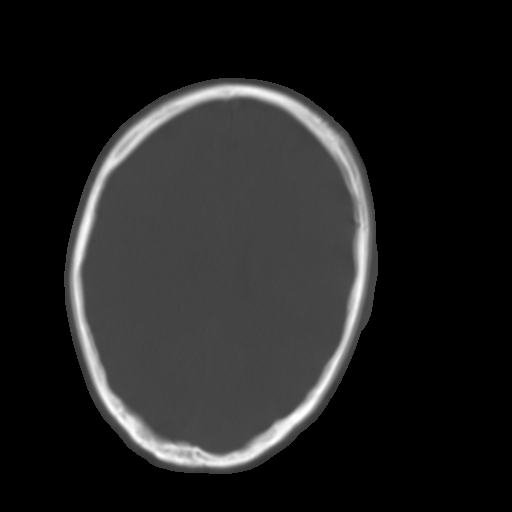
[im 21/31  brain]
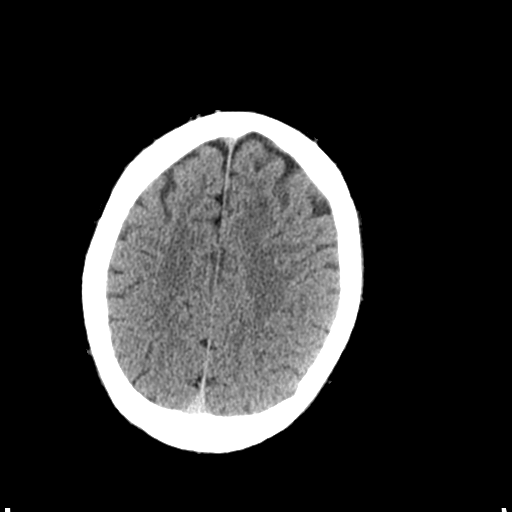
[im 25/31  brain]
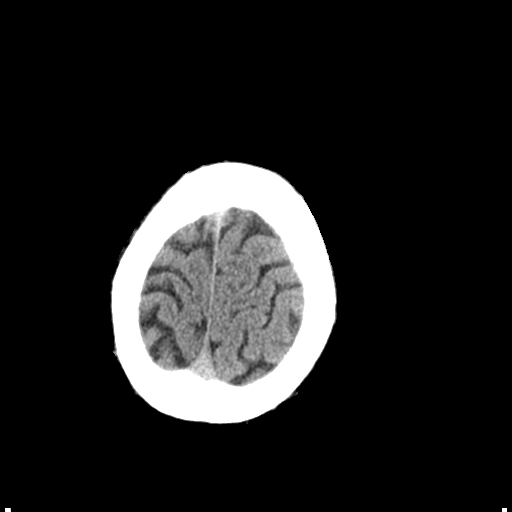
[im 28/31  brain]
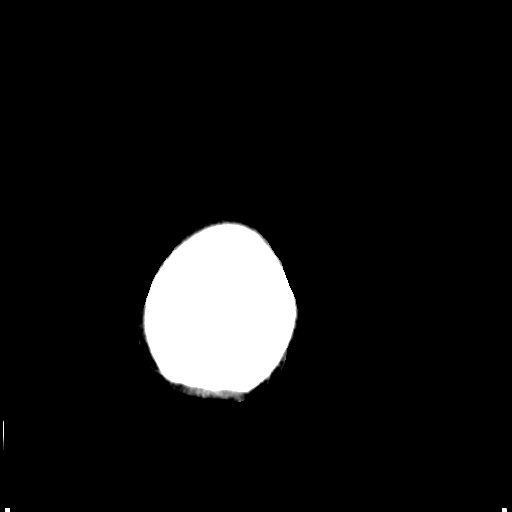

[Series 8: coronal soft · coronal · 0.13mm/px · 3 of 71 slices shown]
[im 18/71  brain]
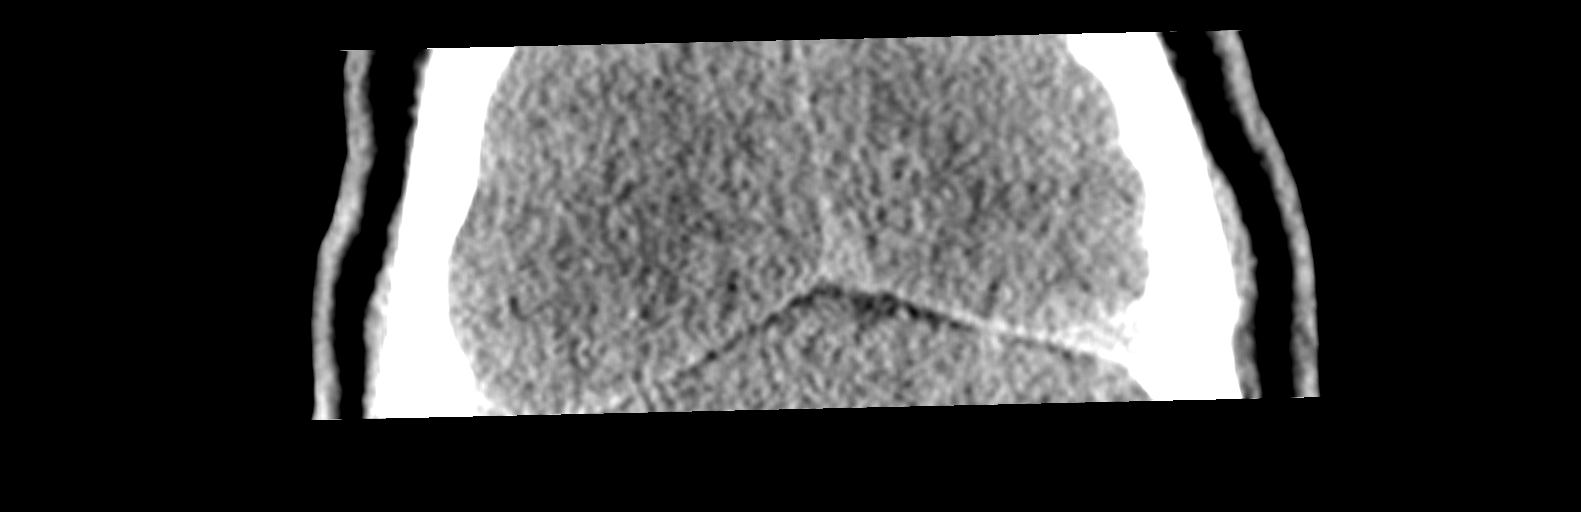
[im 36/71  brain]
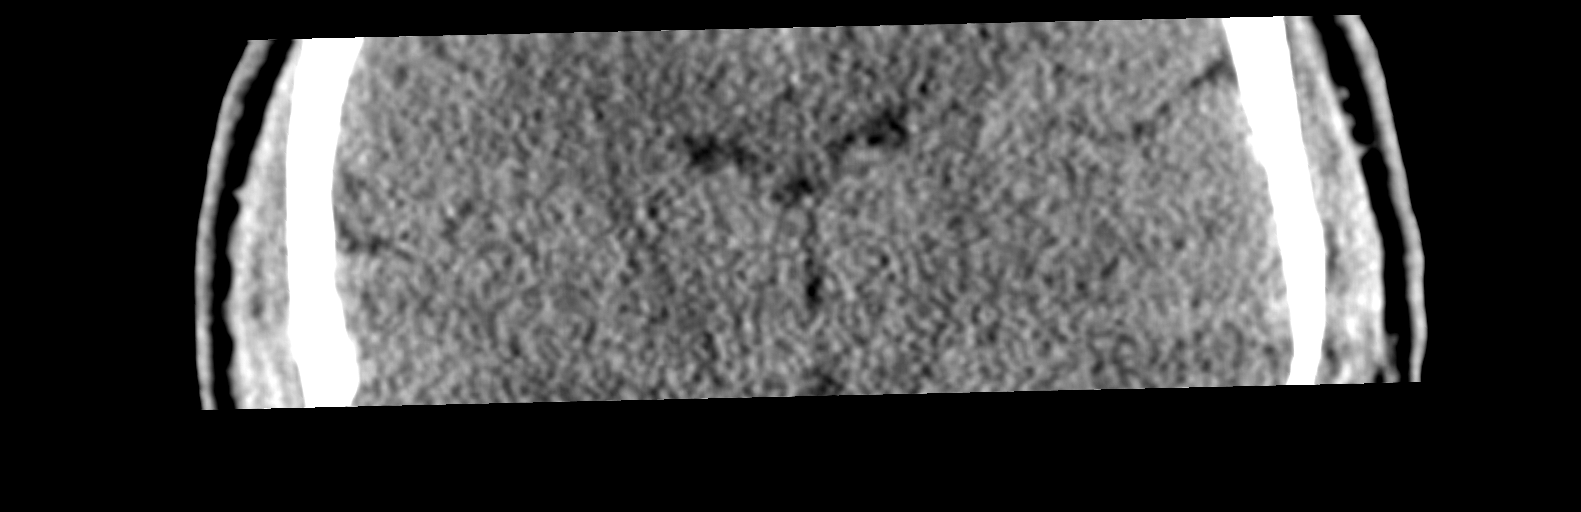
[im 53/71  brain]
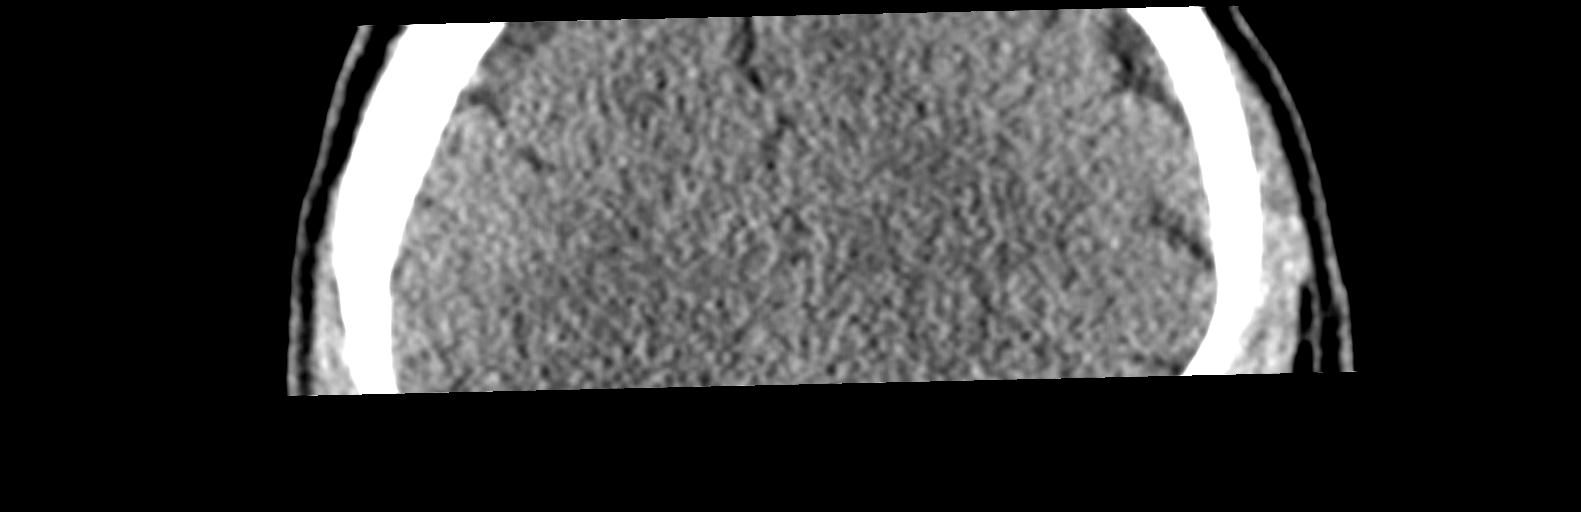

[11 of 47 positions shown; findings below may reference images not displayed]

FINDINGS: Brain: No intracranial hemorrhage, mass effect, or midline shift. No
hydrocephalus. The basilar cisterns are patent. No evidence of
territorial infarct or acute ischemia. No extra-axial or
intracranial fluid collection.

Vascular: No hyperdense vessel or unexpected calcification.

Skull: No fracture or focal lesion.

Sinuses/Orbits: Mucosal thickening throughout the ethmoid air cells
and frontal sinuses. No significant fluid level. The mastoid air
cells are clear. Included orbits are unremarkable.

Other: Stapled vertex scalp hematoma.
IMPRESSION: Stapled vertex scalp hematoma. No acute intracranial abnormality. No
skull fracture.

## 2021-08-14 ENCOUNTER — Encounter (HOSPITAL_COMMUNITY): Payer: Self-pay

## 2021-08-14 ENCOUNTER — Other Ambulatory Visit: Payer: Self-pay

## 2021-08-14 ENCOUNTER — Emergency Department (HOSPITAL_COMMUNITY)
Admission: EM | Admit: 2021-08-14 | Discharge: 2021-08-14 | Disposition: A | Discharge status: Home / Self Care | Payer: BC Managed Care – PPO | Attending: Emergency Medicine | Admitting: Emergency Medicine

## 2021-08-14 DIAGNOSIS — Z5321 Procedure and treatment not carried out due to patient leaving prior to being seen by health care provider: Secondary | ICD-10-CM | POA: Insufficient documentation

## 2021-08-14 DIAGNOSIS — R1110 Vomiting, unspecified: Secondary | ICD-10-CM | POA: Diagnosis not present

## 2021-08-14 DIAGNOSIS — R197 Diarrhea, unspecified: Secondary | ICD-10-CM | POA: Diagnosis not present

## 2021-08-14 NOTE — ED Triage Notes (Signed)
Diarrhea  since Saturday. Emesis since Saturday that stopped yesterday. Denies pain.

## 2021-09-20 ENCOUNTER — Emergency Department (HOSPITAL_COMMUNITY)
Admission: EM | Admit: 2021-09-20 | Discharge: 2021-09-20 | Disposition: A | Discharge status: Home / Self Care | Payer: BC Managed Care – PPO | Attending: Student | Admitting: Student

## 2021-09-20 ENCOUNTER — Other Ambulatory Visit: Payer: Self-pay

## 2021-09-20 ENCOUNTER — Emergency Department (HOSPITAL_COMMUNITY): Payer: BC Managed Care – PPO

## 2021-09-20 ENCOUNTER — Encounter (HOSPITAL_COMMUNITY): Payer: Self-pay | Admitting: Emergency Medicine

## 2021-09-20 DIAGNOSIS — I10 Essential (primary) hypertension: Secondary | ICD-10-CM | POA: Insufficient documentation

## 2021-09-20 DIAGNOSIS — S63281A Dislocation of proximal interphalangeal joint of left index finger, initial encounter: Secondary | ICD-10-CM | POA: Diagnosis not present

## 2021-09-20 DIAGNOSIS — S63259A Unspecified dislocation of unspecified finger, initial encounter: Secondary | ICD-10-CM

## 2021-09-20 DIAGNOSIS — F17210 Nicotine dependence, cigarettes, uncomplicated: Secondary | ICD-10-CM | POA: Insufficient documentation

## 2021-09-20 DIAGNOSIS — S6992XA Unspecified injury of left wrist, hand and finger(s), initial encounter: Secondary | ICD-10-CM | POA: Diagnosis present

## 2021-09-20 DIAGNOSIS — W010XXA Fall on same level from slipping, tripping and stumbling without subsequent striking against object, initial encounter: Secondary | ICD-10-CM | POA: Diagnosis not present

## 2021-09-20 MED ORDER — LIDOCAINE HCL (PF) 1 % IJ SOLN
5.0000 mL | Freq: Once | INTRAMUSCULAR | Status: AC
  Administered 2021-09-20: 5 mL via INTRADERMAL
  Filled 2021-09-20: qty 5

## 2021-09-20 NOTE — ED Provider Notes (Signed)
?[facility] EMERGENCY DEPARTMENT ?Provider Note ? ?CSN: [id number] ?Arrival date & time: 09/20/21 2236 ? ?Chief Complaint(s) ?Finger Injury ? ?HPI ?Seth Allen is a 41 y.o. male who presents emergency department for evaluation of a fall and finger injury.  Patient states that he fell onto outstretched left hand and suffered a left index finger injury.  He arrives with an obvious deformity to the left index finger but no open fracture.  Denies numbness, tingling of the finger.  Limited range of motion secondary to pain.  Denies additional traumatic complaints. ? ?HPI ? ?Past Medical History ?Past Medical History:  ?Diagnosis Date  ? Hypertension   ? ?There are no problems to display for this patient. ? ?Home Medication(s) ?Prior to Admission medications   ?Not on File  ?                                                                                                                                  ?Past Surgical History ?History reviewed. No pertinent surgical history. ?Family History ?No family history on file. ? ?Social History ?Social History  ? ?Tobacco Use  ? Smoking status: Every Day  ?  Packs/day: 1.00  ?  Types: Cigarettes  ? Smokeless tobacco: Never  ?Substance Use Topics  ? Alcohol use: No  ? Drug use: No  ? ?Allergies ?Patient has no known allergies. ? ?Review of Systems ?Review of Systems  ?Musculoskeletal:   ?     Finger pain, deformity  ? ?Physical Exam ?Vital Signs  ?I have reviewed the triage vital signs ?BP (!) 153/86 (BP Location: Right Arm)   Pulse 74   Temp 98.2 ?F (36.8 ?C) (Oral)   Resp 16   Ht 6\' 1"  (1.854 m)   Wt 108.9 kg   SpO2 100%   BMI 31.66 kg/m?  ? ?Physical Exam ?Vitals and nursing note reviewed.  ?Constitutional:   ?   General: He is not in acute distress. ?   Appearance: He is well-developed.  ?HENT:  ?   Head: Normocephalic and atraumatic.  ?Eyes:  ?   Conjunctiva/sclera: Conjunctivae normal.  ?Cardiovascular:  ?   Rate and Rhythm: Normal rate and regular rhythm.  ?   Heart  sounds: No murmur heard. ?Pulmonary:  ?   Effort: Pulmonary effort is normal. No respiratory distress.  ?   Breath sounds: Normal breath sounds.  ?Abdominal:  ?   Palpations: Abdomen is soft.  ?   Tenderness: There is no abdominal tenderness.  ?Musculoskeletal:     ?   General: Swelling, tenderness and deformity present.  ?   Cervical back: Neck supple.  ?Skin: ?   General: Skin is warm and dry.  ?   Capillary Refill: Capillary refill takes less than 2 seconds.  ?Neurological:  ?   Mental Status: He is alert.  ?Psychiatric:     ?   Mood and Affect: Mood normal.  ? ? ?  ED Results and Treatments ?Labs ?(all labs ordered are listed, but only abnormal results are displayed) ?Labs Reviewed - No data to display                                                                                                                       ? ?Radiology ?DG Finger Index Left ? ?Result Date: 09/20/2021 ?CLINICAL DATA:  Fall. EXAM: LEFT INDEX FINGER 2+V COMPARISON:  None. FINDINGS: There is 1 shaft with posterior dislocation at the second proximal interphalangeal joint. No fractures are seen. There is surrounding soft tissue swelling. IMPRESSION: 1. Dislocated second proximal interphalangeal joint. Electronically Signed   By: Darliss Cheney M.D.   On: 09/20/2021 23:04   ? ?Pertinent labs & imaging results that were available during my care of the patient were reviewed by me and considered in my medical decision making (see MDM for details). ? ?Medications Ordered in ED ?Medications  ?lidocaine (PF) (XYLOCAINE) 1 % injection 5 mL (5 mLs Intradermal Given by Other 09/20/21 2325)  ?                                                               ?                                                                    ?Procedures ?Marland KitchenOrtho Injury Treatment ? ?Date/Time: 09/20/2021 11:43 PM ?Performed by: Glendora Score, MD ?Authorized by: Glendora Score, MD  ? ?Consent:  ?  Consent obtained:  Verbal ?  Consent given by:  Patient ?  Risks discussed:   Fracture, nerve damage, restricted joint movement, irreducible dislocation and recurrent dislocation ?  Alternatives discussed:  No treatment and immobilizationInjury location: finger ?Location details: left index finger ?Injury type: dislocation ?Dislocation type: PIP ?Pre-procedure neurovascular assessment: neurovascularly intact ?Pre-procedure distal perfusion: normal ?Pre-procedure range of motion: reduced ?Anesthesia: digital block ? ?Anesthesia: ?Local anesthesia used: yes ?Local Anesthetic: lidocaine 1% without epinephrine ?Anesthetic total: 5 mL ? ?Patient sedated: NoManipulation performed: yes ?Reduction successful: yes ?Immobilization: splint ?Splint type: static finger ?Splint Applied by: ED Tech ?Supplies used: aluminum splint ?Post-procedure neurovascular assessment: post-procedure neurovascularly intact ?Post-procedure distal perfusion: normal ?Post-procedure neurological function: normal ?Post-procedure range of motion: normal ? ? ?Marland KitchenNerve Block ? ?Date/Time: 09/20/2021 11:44 PM ?Performed by: Glendora Score, MD ?Authorized by: Glendora Score, MD  ? ?Consent:  ?  Consent obtained:  Verbal ?Location:  ?  Body area:  Upper extremity ?Pre-procedure details:  ?  Skin preparation:  Alcohol ?Procedure details:  ?  Block needle  gauge:  24 G ?  Anesthetic injected:  Lidocaine 1% w/o epi ?  Steroid injected:  None ?  Additive injected:  None ?  Injection procedure:  Anatomic landmarks identified, incremental injection, negative aspiration for blood, anatomic landmarks palpated and introduced needle ?Post-procedure details:  ?  Dressing:  None ?  Outcome:  Anesthesia achieved ?  Procedure completion:  Tolerated well, no immediate complications ? ?(including critical care time) ? ?Medical Decision Making / ED Course ? ? ?This patient presents to the ED for concern of finger pain, injury, this involves an extensive number of treatment options, and is a complaint that carries with it a high risk of complications  and morbidity.  The differential diagnosis includes fracture, dislocation, foreign body, cellulitis, ligamentous injury ? ?MDM: ?Patient seen emergency department for evaluation of a left index finger injury after a fall.  Physical exam with an obvious deformity to index finger on the left at the level of the PIP.  No additional traumatic injuries seen on physical exam.  Neurologic exam with no sensory deficit to the hand and patient range of motion limited by pain and deformity.  X-ray with dislocation at the PIP.  Digital block performed and digit relocated at bedside.  Immediately after relocation, a definitive pop was heard and patient had immediate return of complete range of motion of the finger.  Informed the patient that we would likely need repeat x-rays to confirm reduction, but patient is requesting to be discharged with outpatient hand follow-up.  This is not unreasonable as he has complete return of range of motion and the deformity has resolved.  High suspicion that the finger has been relocated.  Patient placed in a finger splint in extension and instructed to follow-up with hand within the week. ? ?Additional history obtained: ? ?-External records from outside source obtained and reviewed including: Chart review including previous notes, labs, imaging, consultation notes ? ? ?Lab Tests: ?-I ordered, reviewed, and interpreted labs.   ?The pertinent results include:   ?Labs Reviewed - No data to display  ? ? ?Imaging Studies ordered: ?I ordered imaging studies including finger x-ray ?I independently visualized and interpreted imaging. ?I agree with the radiologist interpretation ? ? ?Medicines ordered and prescription drug management: ?Meds ordered this encounter  ?Medications  ? lidocaine (PF) (XYLOCAINE) 1 % injection 5 mL  ?  ?-I have reviewed the patients home medicines and have made adjustments as needed ? ?Critical interventions ?none ? ?Cardiac Monitoring: ?The patient was maintained on a  cardiac monitor.  I personally viewed and interpreted the cardiac monitored which showed an underlying rhythm of: NSR ? ?Social Determinants of Health:  ?Factors impacting patients care include: none ? ? ?Reevaluation:

## 2021-09-20 NOTE — ED Notes (Signed)
Splint applied to right index finger.

## 2021-09-20 NOTE — ED Triage Notes (Signed)
Pt states he slipped and fell injuring the left index finger.  ?

## 2021-09-25 ENCOUNTER — Telehealth: Payer: Self-pay | Admitting: Orthopedic Surgery

## 2021-09-25 NOTE — Telephone Encounter (Signed)
I called and tried to schedule for 09/26/21 and he was working that day, so he wanted to wait till Thursday ?

## 2021-09-25 NOTE — Telephone Encounter (Signed)
Please work pt in -he was seen in the ED 09-20-21 with a finger injury  ?

## 2021-09-28 ENCOUNTER — Ambulatory Visit: Payer: BC Managed Care – PPO | Admitting: Orthopedic Surgery

## 2022-09-06 ENCOUNTER — Emergency Department (HOSPITAL_COMMUNITY): Admission: EM | Admit: 2022-09-06 | Discharge: 2022-09-06 | Discharge status: Absent without leave | Payer: Self-pay

## 2022-09-26 IMAGING — DX DG FINGER INDEX 2+V*L*
3 series · 3 of 3 positions shown · non-contrast
Comparison: None.

CLINICAL DATA: Fall.

EXAM:
LEFT INDEX FINGER 2+V

[finger ap]
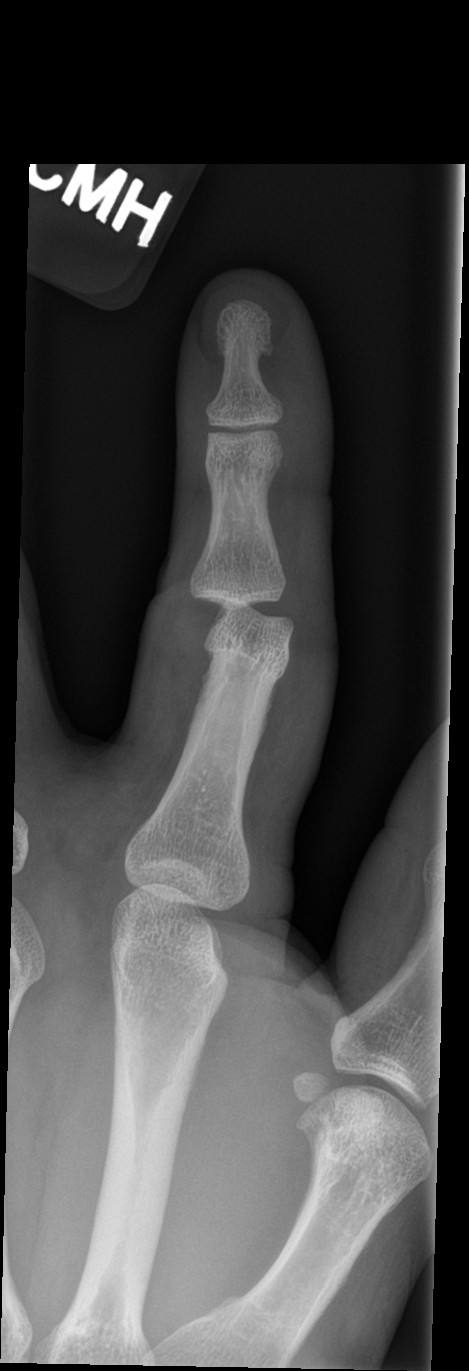

[finger obl]
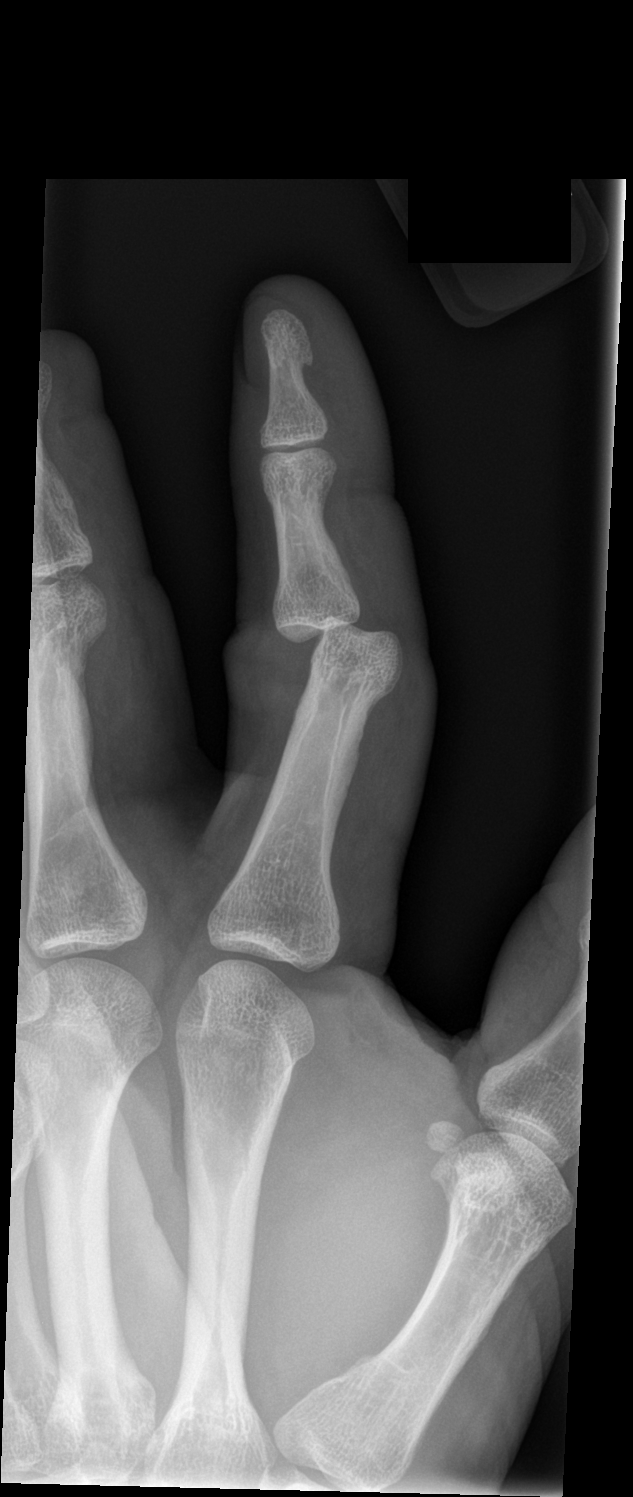

[finger lat]
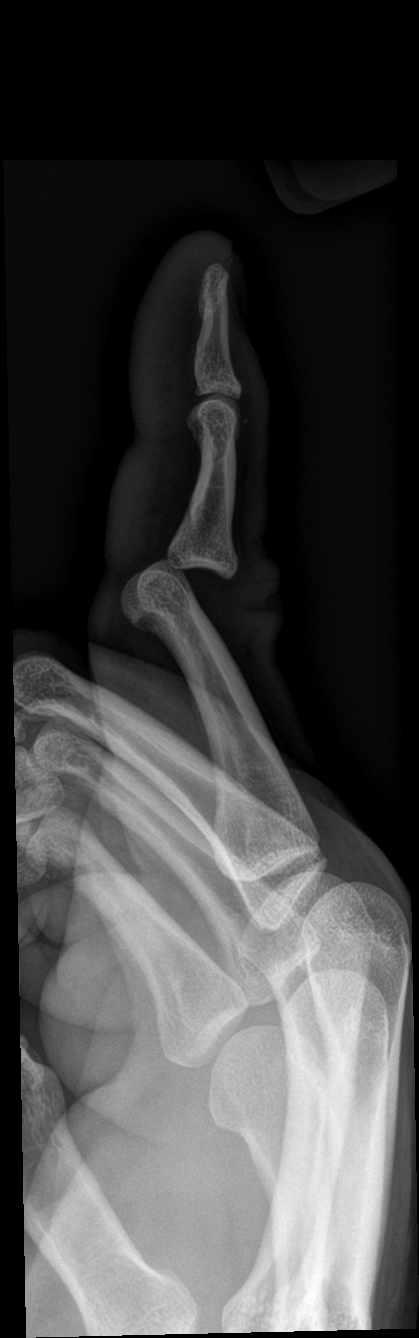

[3 of 3 positions shown; findings below may reference images not displayed]

FINDINGS: There is 1 shaft with posterior dislocation at the second proximal
interphalangeal joint. No fractures are seen. There is surrounding
soft tissue swelling.
IMPRESSION: 1. Dislocated second proximal interphalangeal joint.
